# Patient Record
Sex: Female | Born: 1935 | Race: White | Hispanic: No
Health system: Southern US, Community
[De-identification: ages and names within clinical notes are randomized; demographics above are authoritative.]

---

## 2015-12-31 ENCOUNTER — Ambulatory Visit (HOSPITAL_COMMUNITY)
Admission: AD | Admit: 2015-12-31 | Discharge: 2015-12-31 | Disposition: A | Discharge status: Home / Self Care | Payer: Medicare Other | Source: Other Acute Inpatient Hospital | Attending: Internal Medicine | Admitting: Internal Medicine

## 2015-12-31 ENCOUNTER — Inpatient Hospital Stay
Admission: AD | Admit: 2015-12-31 | Discharge: 2016-01-15 | Disposition: A | Discharge status: Hospice | Payer: Self-pay | Source: Ambulatory Visit | Attending: Internal Medicine | Admitting: Internal Medicine

## 2015-12-31 DIAGNOSIS — Z4659 Encounter for fitting and adjustment of other gastrointestinal appliance and device: Secondary | ICD-10-CM

## 2015-12-31 DIAGNOSIS — J9690 Respiratory failure, unspecified, unspecified whether with hypoxia or hypercapnia: Secondary | ICD-10-CM

## 2015-12-31 DIAGNOSIS — J189 Pneumonia, unspecified organism: Secondary | ICD-10-CM

## 2015-12-31 DIAGNOSIS — I509 Heart failure, unspecified: Secondary | ICD-10-CM | POA: Insufficient documentation

## 2015-12-31 LAB — BLOOD GAS, ARTERIAL
Acid-Base Excess: 0.8 mmol/L (ref 0.0–2.0)
BICARBONATE: 24.8 meq/L — AB (ref 20.0–24.0)
FIO2: 1
O2 SAT: 100 %
PCO2 ART: 39.8 mmHg (ref 35.0–45.0)
PH ART: 7.412 (ref 7.350–7.450)
PO2 ART: 257 mmHg — AB (ref 80.0–100.0)
Patient temperature: 98.6
TCO2: 26.1 mmol/L (ref 0–100)

## 2016-01-01 ENCOUNTER — Other Ambulatory Visit (HOSPITAL_COMMUNITY): Payer: Self-pay

## 2016-01-01 LAB — CBC WITH DIFFERENTIAL/PLATELET
Basophils Absolute: 0 10*3/uL (ref 0.0–0.1)
Basophils Relative: 0 %
EOS ABS: 0.3 10*3/uL (ref 0.0–0.7)
EOS PCT: 2 %
HEMATOCRIT: 41.4 % (ref 36.0–46.0)
HEMOGLOBIN: 12.1 g/dL (ref 12.0–15.0)
LYMPHS ABS: 1 10*3/uL (ref 0.7–4.0)
Lymphocytes Relative: 7 %
MCH: 25.3 pg — AB (ref 26.0–34.0)
MCHC: 29.2 g/dL — ABNORMAL LOW (ref 30.0–36.0)
MCV: 86.6 fL (ref 78.0–100.0)
MONO ABS: 1.2 10*3/uL — AB (ref 0.1–1.0)
MONOS PCT: 8 %
Neutro Abs: 12.2 10*3/uL — ABNORMAL HIGH (ref 1.7–7.7)
Neutrophils Relative %: 83 %
Platelets: 174 10*3/uL (ref 150–400)
RBC: 4.78 MIL/uL (ref 3.87–5.11)
RDW: 16.7 % — ABNORMAL HIGH (ref 11.5–15.5)
WBC: 14.7 10*3/uL — ABNORMAL HIGH (ref 4.0–10.5)

## 2016-01-01 LAB — BLOOD GAS, ARTERIAL
ACID-BASE EXCESS: 1 mmol/L (ref 0.0–2.0)
BICARBONATE: 25 meq/L — AB (ref 20.0–24.0)
O2 Content: 5 L/min
O2 SAT: 97.2 %
PATIENT TEMPERATURE: 98.6
PO2 ART: 89.1 mmHg (ref 80.0–100.0)
TCO2: 26.2 mmol/L (ref 0–100)
pCO2 arterial: 39.8 mmHg (ref 35.0–45.0)
pH, Arterial: 7.414 (ref 7.350–7.450)

## 2016-01-01 LAB — COMPREHENSIVE METABOLIC PANEL
ALBUMIN: 2.6 g/dL — AB (ref 3.5–5.0)
ALK PHOS: 87 U/L (ref 38–126)
ALT: 72 U/L — AB (ref 14–54)
AST: 41 U/L (ref 15–41)
Anion gap: 14 (ref 5–15)
BUN: 52 mg/dL — AB (ref 6–20)
CALCIUM: 8.6 mg/dL — AB (ref 8.9–10.3)
CHLORIDE: 104 mmol/L (ref 101–111)
CO2: 24 mmol/L (ref 22–32)
CREATININE: 1.19 mg/dL — AB (ref 0.44–1.00)
GFR calc non Af Amer: 41 mL/min — ABNORMAL LOW (ref >60)
GFR, EST AFRICAN AMERICAN: 48 mL/min — AB (ref >60)
GLUCOSE: 90 mg/dL (ref 65–99)
Potassium: 4 mmol/L (ref 3.5–5.1)
SODIUM: 142 mmol/L (ref 135–145)
Total Bilirubin: 1.2 mg/dL (ref 0.3–1.2)
Total Protein: 5.3 g/dL — ABNORMAL LOW (ref 6.5–8.1)

## 2016-01-01 LAB — PHOSPHORUS: PHOSPHORUS: 3.4 mg/dL (ref 2.5–4.6)

## 2016-01-01 LAB — PROTIME-INR
INR: 1.49 (ref 0.00–1.49)
PROTHROMBIN TIME: 18.1 s — AB (ref 11.6–15.2)

## 2016-01-01 LAB — TROPONIN I: Troponin I: 0.1 ng/mL — ABNORMAL HIGH (ref <0.031)

## 2016-01-01 LAB — CK TOTAL AND CKMB (NOT AT ARMC)
CK TOTAL: 25 U/L — AB (ref 38–234)
CK, MB: 3.1 ng/mL (ref 0.5–5.0)
Relative Index: INVALID (ref 0.0–2.5)

## 2016-01-01 LAB — TSH: TSH: 9.958 u[IU]/mL — ABNORMAL HIGH (ref 0.350–4.500)

## 2016-01-01 LAB — MAGNESIUM: MAGNESIUM: 2 mg/dL (ref 1.7–2.4)

## 2016-01-01 LAB — PROCALCITONIN: Procalcitonin: <0.1 ng/mL

## 2016-01-02 LAB — PHOSPHORUS: Phosphorus: 2.9 mg/dL (ref 2.5–4.6)

## 2016-01-02 LAB — CBC WITH DIFFERENTIAL/PLATELET
Basophils Absolute: 0 10*3/uL (ref 0.0–0.1)
Basophils Relative: 0 %
EOS ABS: 0.4 10*3/uL (ref 0.0–0.7)
Eosinophils Relative: 3 %
HEMATOCRIT: 39.2 % (ref 36.0–46.0)
HEMOGLOBIN: 11.4 g/dL — AB (ref 12.0–15.0)
LYMPHS ABS: 1 10*3/uL (ref 0.7–4.0)
Lymphocytes Relative: 7 %
MCH: 25.3 pg — AB (ref 26.0–34.0)
MCHC: 29.1 g/dL — AB (ref 30.0–36.0)
MCV: 87.1 fL (ref 78.0–100.0)
MONO ABS: 1 10*3/uL (ref 0.1–1.0)
MONOS PCT: 7 %
NEUTROS PCT: 83 %
Neutro Abs: 11.4 10*3/uL — ABNORMAL HIGH (ref 1.7–7.7)
Platelets: 145 10*3/uL — ABNORMAL LOW (ref 150–400)
RBC: 4.5 MIL/uL (ref 3.87–5.11)
RDW: 16.8 % — ABNORMAL HIGH (ref 11.5–15.5)
WBC: 13.7 10*3/uL — ABNORMAL HIGH (ref 4.0–10.5)

## 2016-01-02 LAB — COMPREHENSIVE METABOLIC PANEL
ALK PHOS: 77 U/L (ref 38–126)
ALT: 56 U/L — ABNORMAL HIGH (ref 14–54)
ANION GAP: 8 (ref 5–15)
AST: 32 U/L (ref 15–41)
Albumin: 2.4 g/dL — ABNORMAL LOW (ref 3.5–5.0)
BILIRUBIN TOTAL: 1.1 mg/dL (ref 0.3–1.2)
BUN: 50 mg/dL — ABNORMAL HIGH (ref 6–20)
CALCIUM: 8.6 mg/dL — AB (ref 8.9–10.3)
CO2: 27 mmol/L (ref 22–32)
Chloride: 109 mmol/L (ref 101–111)
Creatinine, Ser: 1.29 mg/dL — ABNORMAL HIGH (ref 0.44–1.00)
GFR calc non Af Amer: 37 mL/min — ABNORMAL LOW (ref >60)
GFR, EST AFRICAN AMERICAN: 43 mL/min — AB (ref >60)
Glucose, Bld: 90 mg/dL (ref 65–99)
Potassium: 3.7 mmol/L (ref 3.5–5.1)
SODIUM: 144 mmol/L (ref 135–145)
TOTAL PROTEIN: 5.2 g/dL — AB (ref 6.5–8.1)

## 2016-01-02 LAB — HEMOGLOBIN A1C
Hgb A1c MFr Bld: 5.8 % — ABNORMAL HIGH (ref 4.8–5.6)
Mean Plasma Glucose: 120 mg/dL

## 2016-01-02 LAB — MAGNESIUM: Magnesium: 2.1 mg/dL (ref 1.7–2.4)

## 2016-01-03 LAB — BASIC METABOLIC PANEL
ANION GAP: 12 (ref 5–15)
BUN: 46 mg/dL — AB (ref 6–20)
CALCIUM: 8.6 mg/dL — AB (ref 8.9–10.3)
CO2: 27 mmol/L (ref 22–32)
Chloride: 108 mmol/L (ref 101–111)
Creatinine, Ser: 1.14 mg/dL — ABNORMAL HIGH (ref 0.44–1.00)
GFR calc Af Amer: 50 mL/min — ABNORMAL LOW (ref >60)
GFR, EST NON AFRICAN AMERICAN: 44 mL/min — AB (ref >60)
GLUCOSE: 100 mg/dL — AB (ref 65–99)
POTASSIUM: 3.4 mmol/L — AB (ref 3.5–5.1)
SODIUM: 147 mmol/L — AB (ref 135–145)

## 2016-01-03 LAB — CBC WITH DIFFERENTIAL/PLATELET
BASOS ABS: 0 10*3/uL (ref 0.0–0.1)
Basophils Relative: 0 %
EOS ABS: 0.2 10*3/uL (ref 0.0–0.7)
EOS PCT: 2 %
HCT: 37.8 % (ref 36.0–46.0)
Hemoglobin: 11 g/dL — ABNORMAL LOW (ref 12.0–15.0)
Lymphocytes Relative: 7 %
Lymphs Abs: 0.9 10*3/uL (ref 0.7–4.0)
MCH: 25.2 pg — AB (ref 26.0–34.0)
MCHC: 29.1 g/dL — ABNORMAL LOW (ref 30.0–36.0)
MCV: 86.7 fL (ref 78.0–100.0)
MONO ABS: 1 10*3/uL (ref 0.1–1.0)
Monocytes Relative: 8 %
Neutro Abs: 10.1 10*3/uL — ABNORMAL HIGH (ref 1.7–7.7)
Neutrophils Relative %: 83 %
PLATELETS: 143 10*3/uL — AB (ref 150–400)
RBC: 4.36 MIL/uL (ref 3.87–5.11)
RDW: 17.1 % — AB (ref 11.5–15.5)
WBC: 12.1 10*3/uL — AB (ref 4.0–10.5)

## 2016-01-03 LAB — PHOSPHORUS: Phosphorus: 2.8 mg/dL (ref 2.5–4.6)

## 2016-01-03 LAB — MAGNESIUM: MAGNESIUM: 1.9 mg/dL (ref 1.7–2.4)

## 2016-01-04 LAB — RENAL FUNCTION PANEL
ALBUMIN: 2.4 g/dL — AB (ref 3.5–5.0)
ANION GAP: 13 (ref 5–15)
BUN: 44 mg/dL — AB (ref 6–20)
CO2: 24 mmol/L (ref 22–32)
Calcium: 8.8 mg/dL — ABNORMAL LOW (ref 8.9–10.3)
Chloride: 109 mmol/L (ref 101–111)
Creatinine, Ser: 1.23 mg/dL — ABNORMAL HIGH (ref 0.44–1.00)
GFR calc Af Amer: 46 mL/min — ABNORMAL LOW (ref >60)
GFR calc non Af Amer: 40 mL/min — ABNORMAL LOW (ref >60)
GLUCOSE: 131 mg/dL — AB (ref 65–99)
PHOSPHORUS: 2.5 mg/dL (ref 2.5–4.6)
POTASSIUM: 4.1 mmol/L (ref 3.5–5.1)
Sodium: 146 mmol/L — ABNORMAL HIGH (ref 135–145)

## 2016-01-04 LAB — BRAIN NATRIURETIC PEPTIDE: B Natriuretic Peptide: 2762.1 pg/mL — ABNORMAL HIGH (ref 0.0–100.0)

## 2016-01-04 LAB — MAGNESIUM: Magnesium: 1.9 mg/dL (ref 1.7–2.4)

## 2016-01-06 LAB — RENAL FUNCTION PANEL
ANION GAP: 11 (ref 5–15)
Albumin: 2.5 g/dL — ABNORMAL LOW (ref 3.5–5.0)
BUN: 54 mg/dL — ABNORMAL HIGH (ref 6–20)
CALCIUM: 8.8 mg/dL — AB (ref 8.9–10.3)
CHLORIDE: 106 mmol/L (ref 101–111)
CO2: 27 mmol/L (ref 22–32)
CREATININE: 1.67 mg/dL — AB (ref 0.44–1.00)
GFR, EST AFRICAN AMERICAN: 32 mL/min — AB (ref >60)
GFR, EST NON AFRICAN AMERICAN: 27 mL/min — AB (ref >60)
Glucose, Bld: 91 mg/dL (ref 65–99)
Phosphorus: 4.1 mg/dL (ref 2.5–4.6)
Potassium: 3.8 mmol/L (ref 3.5–5.1)
SODIUM: 144 mmol/L (ref 135–145)

## 2016-01-06 LAB — CBC WITH DIFFERENTIAL/PLATELET
BASOS ABS: 0.1 10*3/uL (ref 0.0–0.1)
Basophils Relative: 1 %
EOS PCT: 3 %
Eosinophils Absolute: 0.3 10*3/uL (ref 0.0–0.7)
HCT: 38.8 % (ref 36.0–46.0)
Hemoglobin: 10.9 g/dL — ABNORMAL LOW (ref 12.0–15.0)
LYMPHS ABS: 1.1 10*3/uL (ref 0.7–4.0)
LYMPHS PCT: 11 %
MCH: 24.9 pg — AB (ref 26.0–34.0)
MCHC: 28.1 g/dL — ABNORMAL LOW (ref 30.0–36.0)
MCV: 88.6 fL (ref 78.0–100.0)
Monocytes Absolute: 0.6 10*3/uL (ref 0.1–1.0)
Monocytes Relative: 6 %
NEUTROS PCT: 79 %
Neutro Abs: 8.3 10*3/uL — ABNORMAL HIGH (ref 1.7–7.7)
PLATELETS: 163 10*3/uL (ref 150–400)
RBC: 4.38 MIL/uL (ref 3.87–5.11)
RDW: 17.4 % — ABNORMAL HIGH (ref 11.5–15.5)
WBC: 10.4 10*3/uL (ref 4.0–10.5)

## 2016-01-06 LAB — MAGNESIUM: MAGNESIUM: 1.9 mg/dL (ref 1.7–2.4)

## 2016-01-07 LAB — MAGNESIUM: Magnesium: 1.8 mg/dL (ref 1.7–2.4)

## 2016-01-07 LAB — RENAL FUNCTION PANEL
Albumin: 2.4 g/dL — ABNORMAL LOW (ref 3.5–5.0)
Anion gap: 12 (ref 5–15)
BUN: 57 mg/dL — AB (ref 6–20)
CHLORIDE: 102 mmol/L (ref 101–111)
CO2: 25 mmol/L (ref 22–32)
Calcium: 8.4 mg/dL — ABNORMAL LOW (ref 8.9–10.3)
Creatinine, Ser: 1.86 mg/dL — ABNORMAL HIGH (ref 0.44–1.00)
GFR calc Af Amer: 28 mL/min — ABNORMAL LOW (ref >60)
GFR calc non Af Amer: 24 mL/min — ABNORMAL LOW (ref >60)
GLUCOSE: 95 mg/dL (ref 65–99)
POTASSIUM: 3.8 mmol/L (ref 3.5–5.1)
Phosphorus: 3.9 mg/dL (ref 2.5–4.6)
Sodium: 139 mmol/L (ref 135–145)

## 2016-01-08 LAB — RENAL FUNCTION PANEL
Albumin: 2.5 g/dL — ABNORMAL LOW (ref 3.5–5.0)
Anion gap: 13 (ref 5–15)
BUN: 71 mg/dL — ABNORMAL HIGH (ref 6–20)
CALCIUM: 8.5 mg/dL — AB (ref 8.9–10.3)
CO2: 24 mmol/L (ref 22–32)
CREATININE: 2.54 mg/dL — AB (ref 0.44–1.00)
Chloride: 102 mmol/L (ref 101–111)
GFR, EST AFRICAN AMERICAN: 19 mL/min — AB (ref >60)
GFR, EST NON AFRICAN AMERICAN: 17 mL/min — AB (ref >60)
Glucose, Bld: 100 mg/dL — ABNORMAL HIGH (ref 65–99)
Phosphorus: 4.8 mg/dL — ABNORMAL HIGH (ref 2.5–4.6)
Potassium: 4.3 mmol/L (ref 3.5–5.1)
SODIUM: 139 mmol/L (ref 135–145)

## 2016-01-08 LAB — CBC WITH DIFFERENTIAL/PLATELET
BASOS ABS: 0.1 10*3/uL (ref 0.0–0.1)
Basophils Relative: 1 %
EOS ABS: 0.2 10*3/uL (ref 0.0–0.7)
EOS PCT: 1 %
HCT: 38.6 % (ref 36.0–46.0)
Hemoglobin: 11.7 g/dL — ABNORMAL LOW (ref 12.0–15.0)
Lymphocytes Relative: 13 %
Lymphs Abs: 1.8 10*3/uL (ref 0.7–4.0)
MCH: 26.3 pg (ref 26.0–34.0)
MCHC: 30.3 g/dL (ref 30.0–36.0)
MCV: 86.7 fL (ref 78.0–100.0)
Monocytes Absolute: 0.9 10*3/uL (ref 0.1–1.0)
Monocytes Relative: 7 %
Neutro Abs: 10.6 10*3/uL — ABNORMAL HIGH (ref 1.7–7.7)
Neutrophils Relative %: 78 %
PLATELETS: 232 10*3/uL (ref 150–400)
RBC: 4.45 MIL/uL (ref 3.87–5.11)
RDW: 17.5 % — ABNORMAL HIGH (ref 11.5–15.5)
WBC: 13.5 10*3/uL — AB (ref 4.0–10.5)

## 2016-01-08 LAB — MAGNESIUM: MAGNESIUM: 2 mg/dL (ref 1.7–2.4)

## 2016-01-09 ENCOUNTER — Other Ambulatory Visit (HOSPITAL_COMMUNITY): Payer: Self-pay

## 2016-01-09 LAB — CBC WITH DIFFERENTIAL/PLATELET
Basophils Absolute: 0.1 10*3/uL (ref 0.0–0.1)
Basophils Relative: 0 %
EOS PCT: 1 %
Eosinophils Absolute: 0.1 10*3/uL (ref 0.0–0.7)
HEMATOCRIT: 41.2 % (ref 36.0–46.0)
HEMOGLOBIN: 12.6 g/dL (ref 12.0–15.0)
LYMPHS PCT: 12 %
Lymphs Abs: 2 10*3/uL (ref 0.7–4.0)
MCH: 25.9 pg — AB (ref 26.0–34.0)
MCHC: 30.6 g/dL (ref 30.0–36.0)
MCV: 84.6 fL (ref 78.0–100.0)
Monocytes Absolute: 1 10*3/uL (ref 0.1–1.0)
Monocytes Relative: 6 %
NEUTROS ABS: 14 10*3/uL — AB (ref 1.7–7.7)
Neutrophils Relative %: 81 %
Platelets: 276 10*3/uL (ref 150–400)
RBC: 4.87 MIL/uL (ref 3.87–5.11)
RDW: 17.5 % — ABNORMAL HIGH (ref 11.5–15.5)
WBC: 17.2 10*3/uL — AB (ref 4.0–10.5)

## 2016-01-09 LAB — RENAL FUNCTION PANEL
ANION GAP: 13 (ref 5–15)
Albumin: 2.3 g/dL — ABNORMAL LOW (ref 3.5–5.0)
BUN: 85 mg/dL — ABNORMAL HIGH (ref 6–20)
CHLORIDE: 101 mmol/L (ref 101–111)
CO2: 22 mmol/L (ref 22–32)
Calcium: 8.5 mg/dL — ABNORMAL LOW (ref 8.9–10.3)
Creatinine, Ser: 3.37 mg/dL — ABNORMAL HIGH (ref 0.44–1.00)
GFR calc non Af Amer: 12 mL/min — ABNORMAL LOW (ref >60)
GFR, EST AFRICAN AMERICAN: 14 mL/min — AB (ref >60)
Glucose, Bld: 133 mg/dL — ABNORMAL HIGH (ref 65–99)
POTASSIUM: 4.5 mmol/L (ref 3.5–5.1)
Phosphorus: 6.1 mg/dL — ABNORMAL HIGH (ref 2.5–4.6)
Sodium: 136 mmol/L (ref 135–145)

## 2016-01-09 LAB — PROTIME-INR
INR: 1.8 — AB (ref 0.00–1.49)
PROTHROMBIN TIME: 20.9 s — AB (ref 11.6–15.2)

## 2016-01-09 LAB — MAGNESIUM: MAGNESIUM: 2.2 mg/dL (ref 1.7–2.4)

## 2016-01-10 ENCOUNTER — Other Ambulatory Visit (HOSPITAL_COMMUNITY): Payer: Self-pay

## 2016-01-10 LAB — PROTIME-INR
INR: 1.82 — ABNORMAL HIGH (ref 0.00–1.49)
Prothrombin Time: 21 seconds — ABNORMAL HIGH (ref 11.6–15.2)

## 2016-01-10 LAB — RENAL FUNCTION PANEL
Albumin: 2.2 g/dL — ABNORMAL LOW (ref 3.5–5.0)
Anion gap: 12 (ref 5–15)
BUN: 95 mg/dL — AB (ref 6–20)
CHLORIDE: 105 mmol/L (ref 101–111)
CO2: 24 mmol/L (ref 22–32)
CREATININE: 4.26 mg/dL — AB (ref 0.44–1.00)
Calcium: 8.4 mg/dL — ABNORMAL LOW (ref 8.9–10.3)
GFR calc Af Amer: 10 mL/min — ABNORMAL LOW (ref >60)
GFR calc non Af Amer: 9 mL/min — ABNORMAL LOW (ref >60)
Glucose, Bld: 112 mg/dL — ABNORMAL HIGH (ref 65–99)
Phosphorus: 6.9 mg/dL — ABNORMAL HIGH (ref 2.5–4.6)
Potassium: 4.4 mmol/L (ref 3.5–5.1)
Sodium: 141 mmol/L (ref 135–145)

## 2016-01-10 LAB — CBC WITH DIFFERENTIAL/PLATELET
Basophils Absolute: 0.1 10*3/uL (ref 0.0–0.1)
Basophils Relative: 0 %
EOS PCT: 0 %
Eosinophils Absolute: 0.1 10*3/uL (ref 0.0–0.7)
HEMATOCRIT: 41.3 % (ref 36.0–46.0)
Hemoglobin: 12.5 g/dL (ref 12.0–15.0)
LYMPHS ABS: 1.9 10*3/uL (ref 0.7–4.0)
LYMPHS PCT: 11 %
MCH: 25.6 pg — AB (ref 26.0–34.0)
MCHC: 30.3 g/dL (ref 30.0–36.0)
MCV: 84.6 fL (ref 78.0–100.0)
MONO ABS: 1 10*3/uL (ref 0.1–1.0)
Monocytes Relative: 6 %
Neutro Abs: 14 10*3/uL — ABNORMAL HIGH (ref 1.7–7.7)
Neutrophils Relative %: 83 %
PLATELETS: 275 10*3/uL (ref 150–400)
RBC: 4.88 MIL/uL (ref 3.87–5.11)
RDW: 17.7 % — AB (ref 11.5–15.5)
WBC: 17 10*3/uL — ABNORMAL HIGH (ref 4.0–10.5)

## 2016-01-10 LAB — MAGNESIUM: Magnesium: 2.2 mg/dL (ref 1.7–2.4)

## 2016-01-11 LAB — CBC WITH DIFFERENTIAL/PLATELET
Basophils Absolute: 0 10*3/uL (ref 0.0–0.1)
Basophils Relative: 0 %
EOS ABS: 0.1 10*3/uL (ref 0.0–0.7)
Eosinophils Relative: 1 %
HEMATOCRIT: 38.4 % (ref 36.0–46.0)
HEMOGLOBIN: 11.5 g/dL — AB (ref 12.0–15.0)
LYMPHS ABS: 1.3 10*3/uL (ref 0.7–4.0)
LYMPHS PCT: 9 %
MCH: 25.3 pg — AB (ref 26.0–34.0)
MCHC: 29.9 g/dL — AB (ref 30.0–36.0)
MCV: 84.6 fL (ref 78.0–100.0)
MONOS PCT: 7 %
Monocytes Absolute: 1 10*3/uL (ref 0.1–1.0)
NEUTROS PCT: 83 %
Neutro Abs: 12.9 10*3/uL — ABNORMAL HIGH (ref 1.7–7.7)
Platelets: 249 10*3/uL (ref 150–400)
RBC: 4.54 MIL/uL (ref 3.87–5.11)
RDW: 17.6 % — ABNORMAL HIGH (ref 11.5–15.5)
WBC: 15.4 10*3/uL — ABNORMAL HIGH (ref 4.0–10.5)

## 2016-01-11 LAB — URINALYSIS, ROUTINE W REFLEX MICROSCOPIC
GLUCOSE, UA: 100 mg/dL — AB
KETONES UR: 15 mg/dL — AB
Nitrite: POSITIVE — AB
PH: 5 (ref 5.0–8.0)
Protein, ur: 100 mg/dL — AB
Specific Gravity, Urine: 1.03 (ref 1.005–1.030)

## 2016-01-11 LAB — RENAL FUNCTION PANEL
ANION GAP: 15 (ref 5–15)
Albumin: 2.3 g/dL — ABNORMAL LOW (ref 3.5–5.0)
BUN: 113 mg/dL — ABNORMAL HIGH (ref 6–20)
CALCIUM: 8.4 mg/dL — AB (ref 8.9–10.3)
CHLORIDE: 103 mmol/L (ref 101–111)
CO2: 21 mmol/L — AB (ref 22–32)
Creatinine, Ser: 4.76 mg/dL — ABNORMAL HIGH (ref 0.44–1.00)
GFR calc non Af Amer: 8 mL/min — ABNORMAL LOW (ref >60)
GFR, EST AFRICAN AMERICAN: 9 mL/min — AB (ref >60)
GLUCOSE: 139 mg/dL — AB (ref 65–99)
PHOSPHORUS: 6 mg/dL — AB (ref 2.5–4.6)
POTASSIUM: 4 mmol/L (ref 3.5–5.1)
SODIUM: 139 mmol/L (ref 135–145)

## 2016-01-11 LAB — PROTIME-INR
INR: 1.97 — ABNORMAL HIGH (ref 0.00–1.49)
PROTHROMBIN TIME: 22.3 s — AB (ref 11.6–15.2)

## 2016-01-11 LAB — URINE MICROSCOPIC-ADD ON

## 2016-01-11 LAB — MAGNESIUM: MAGNESIUM: 2.4 mg/dL (ref 1.7–2.4)

## 2016-01-12 LAB — PROTIME-INR
INR: 2.2 — AB (ref 0.00–1.49)
Prothrombin Time: 24.2 seconds — ABNORMAL HIGH (ref 11.6–15.2)

## 2016-01-12 LAB — URINE CULTURE

## 2016-01-13 ENCOUNTER — Other Ambulatory Visit (HOSPITAL_COMMUNITY): Payer: Self-pay

## 2016-01-13 LAB — CBC
HEMATOCRIT: 34.3 % — AB (ref 36.0–46.0)
HEMOGLOBIN: 10.4 g/dL — AB (ref 12.0–15.0)
MCH: 26.3 pg (ref 26.0–34.0)
MCHC: 30.3 g/dL (ref 30.0–36.0)
MCV: 86.6 fL (ref 78.0–100.0)
Platelets: 242 10*3/uL (ref 150–400)
RBC: 3.96 MIL/uL (ref 3.87–5.11)
RDW: 18.2 % — ABNORMAL HIGH (ref 11.5–15.5)
WBC: 14.7 10*3/uL — ABNORMAL HIGH (ref 4.0–10.5)

## 2016-01-13 LAB — BLOOD GAS, ARTERIAL
Acid-base deficit: 6.3 mmol/L — ABNORMAL HIGH (ref 0.0–2.0)
Bicarbonate: 19.2 mEq/L — ABNORMAL LOW (ref 20.0–24.0)
FIO2: 1
O2 Saturation: 93.7 %
PCO2 ART: 39.3 mmHg (ref 35.0–45.0)
PH ART: 7.303 — AB (ref 7.350–7.450)
Patient temperature: 96.6
TCO2: 20.4 mmol/L (ref 0–100)
pO2, Arterial: 73.1 mmHg — ABNORMAL LOW (ref 80.0–100.0)

## 2016-01-13 LAB — PROTIME-INR
INR: 2.04 — ABNORMAL HIGH (ref 0.00–1.49)
Prothrombin Time: 22.9 seconds — ABNORMAL HIGH (ref 11.6–15.2)

## 2016-01-13 LAB — BASIC METABOLIC PANEL
Anion gap: 17 — ABNORMAL HIGH (ref 5–15)
BUN: 148 mg/dL — ABNORMAL HIGH (ref 6–20)
CHLORIDE: 107 mmol/L (ref 101–111)
CO2: 15 mmol/L — ABNORMAL LOW (ref 22–32)
CREATININE: 5.96 mg/dL — AB (ref 0.44–1.00)
Calcium: 8.9 mg/dL (ref 8.9–10.3)
GFR, EST AFRICAN AMERICAN: 7 mL/min — AB (ref >60)
GFR, EST NON AFRICAN AMERICAN: 6 mL/min — AB (ref >60)
Glucose, Bld: 115 mg/dL — ABNORMAL HIGH (ref 65–99)
POTASSIUM: 5.3 mmol/L — AB (ref 3.5–5.1)
SODIUM: 139 mmol/L (ref 135–145)

## 2016-01-14 LAB — PROTIME-INR
INR: 2.47 — AB (ref 0.00–1.49)
PROTHROMBIN TIME: 26.5 s — AB (ref 11.6–15.2)

## 2016-01-14 NOTE — Consult Note (Signed)
Date: 01/14/2016                  Patient Name:  Briana Mendez  MRN: [id number]  DOB: 08-14-1931  Age / Sex: 82 y.o., female         PCP: No PCP Per Patient                 Service Requesting Consult: [facility]                 Reason for Consult: ARF            History of Present Illness: Patient is a 82 y.o. female with medical problems of osteoarthritis, hypothyroidism, breast cancer, hypertension, left mastectomy, hysterectomy, who was admitted to [facility] on 12/31/2015.  Patient was originally admitted to emergency room Hospital on April 2 for pneumonia and respiratory failure. Upon presentation she had bilateral pleural effusions and consolidation consistent with pneumonia. According to her discharge summary, her 2-D echo showed mitral regurgitation. She had several episodes of flash pulmonary edema. she was treated with multiple antibiotics. Patient underwent a cardiac catheterization on April 17 Nephrology consult has been requested for evaluation of acute renal failure It is noted that since April 20, her creatinine has been increasing. Most recently, and has worsened to 5.96, BUN level critically elevated at 148 Patient is obtunded, she is unable to provide any meaningful history. All information is obtained from her daughter and from patient's chart  Her pulmonary status remains tenuous. Most recent chest x-ray shows bilateral pleural effusions, diffuse pulmonary edema, worsening opacity in the left upper lobe   Medications: Outpatient medications: No prescriptions prior to admission    Current medications: No current facility-administered medications for this encounter.      Allergies: Allergies not on file    Past Medical History: No past medical history on file.   Past Surgical History: No past surgical history on file.   Family History: No family history on file.   Social History: Social History   Social  History  . Marital Status: Widowed    Spouse Name: N/A  . Number of Children: N/A  . Years of Education: N/A   Occupational History  . Not on file.   Social History Main Topics  . Smoking status: Not on file  . Smokeless tobacco: Not on file  . Alcohol Use: Not on file  . Drug Use: Not on file  . Sexual Activity: Not on file   Other Topics Concern  . Not on file   Social History Narrative  . No narrative on file     Review of Systems: unavailable due to the patient's condition Gen:  HEENT:  CV:  Resp:  GI: GU :  MS:  Derm:   Psych: Heme:  Neuro:  Endocrine  Vital Signs: There were no vitals taken for this visit.  No intake or output data in the 24 hours ending 01/14/16 1752  Weight trends: There were no vitals filed for this visit.  Physical Exam: General:  frail, elderly, critically ill-appearing  HEENT Anicteric, drug mucous membranes  Neck:  supple, no masses  Lungs: Coarse breath sounds bilaterally, requiring oxygen by facemask  Heart::  atrial fibrillation, irregular rhythm  Abdomen: Soft, nontender, nondistended  Extremities:  + dependent peripheral edema  Neurologic: Obtunded, not able to follow commands  Skin: Scattered ecchymosis             Lab results: Basic Metabolic Panel:  Recent Labs Lab 01/09/16 0615 01/10/16 0656 01/11/16 0630 01/11/16 0631 01/13/16 1140  NA 136 141  --  139 139  K 4.5 4.4  --  4.0 5.3*  CL 101 105  --  103 107  CO2 22 24  --  21* 15*  GLUCOSE 133* 112*  --  139* 115*  BUN 85* 95*  --  113* 148*  CREATININE 3.37* 4.26*  --  4.76* 5.96*  CALCIUM 8.5* 8.4*  --  8.4* 8.9  MG 2.2 2.2 2.4  --   --   PHOS 6.1* 6.9*  --  6.0*  --     Liver Function Tests:  Recent Labs Lab 01/11/16 0631  ALBUMIN 2.3*   No results for input(s): LIPASE, AMYLASE in the last 168 hours. No results for input(s): AMMONIA in the last 168 hours.  CBC:  Recent Labs Lab 01/10/16 0656 01/11/16 0630 01/13/16 1140  WBC  17.0* 15.4* 14.7*  NEUTROABS 14.0* 12.9*  --   HGB 12.5 11.5* 10.4*  HCT 41.3 38.4 34.3*  MCV 84.6 84.6 86.6  PLT 275 249 242    Cardiac Enzymes: No results for input(s): CKTOTAL, TROPONINI in the last 168 hours.  BNP: Invalid input(s): POCBNP  CBG: No results for input(s): GLUCAP in the last 168 hours.  Microbiology: Recent Results (from the past 720 hour(s))  Culture, Urine     Status: Abnormal   Collection Time: 01/10/16  5:46 PM  Result Value Ref Range Status   Specimen Description URINE, RANDOM  Final   Special Requests NONE  Final   Culture >=100,000 COLONIES/mL YEAST (A)  Final   Report Status 01/12/2016 FINAL  Final     Coagulation Studies:  Recent Labs  01/12/16 0802 01/13/16 0808 01/14/16 0625  LABPROT 24.2* 22.9* 26.5*  INR 2.20* 2.04* 2.47*    Urinalysis: No results for input(s): COLORURINE, LABSPEC, PHURINE, GLUCOSEU, HGBUR, BILIRUBINUR, KETONESUR, PROTEINUR, UROBILINOGEN, NITRITE, LEUKOCYTESUR in the last 72 hours.  Invalid input(s): APPERANCEUR      Imaging: Dg Chest Port 1 View  01/13/2016  CLINICAL DATA:  Respiratory failure.  Aspiration pneumonia. EXAM: PORTABLE CHEST 1 VIEW COMPARISON:  01/09/2016 FINDINGS: Cardiac enlargement and aortic atherosclerosis noted. There are bilateral pleural effusions identified left greater than right. Diffuse pulmonary edema noted. There is worsening airspace opacification in the left upper lobe when compared with previous exam. Similar aeration of both lower lobes. IMPRESSION: 1. Similar aeration to both lower lobes. 2. Worsening aeration to the left upper lobe. Electronically Signed   By: Signa Kell M.D.   On: 01/13/2016 10:27      Assessment & Plan: Pt is a 82 y.o. yo female with osteoarthritis, hypothyroidism, breast cancer, hypertension, left mastectomy, hysterectomy, who was admitted to [facility] on 12/31/2015.   1. Acute kidney injury on chronic kidney disease stage III Baseline  creatinine is probably 1.14/44 Patient underwent a cardiac catheterization on April 17. Therefore, her acute kidney injury is most likely secondary to IV contrast exposure  Patient he is currently oliguric. Her BUN/creatinine level a critically high. I have discussed this case with the primary team as well as patient's daughter Is overall very difficult situation.  The risks, benefits and alternatives on dialysis were discussed with patient's daughter. Dialysis and current medical condition would be a high risk procedure. We are not sure how well the patient will be able to tolerated hemodynamically. The family will get together tonight and make a decision about aggressiveness of the care. If  they decide to pursue dialysis, primary team will order temporary dialysis catheter placement by vascular radiology.  We will do a short treatment of dialysis on a daily basis and try to see if we can correct UREMIA and improve her mental status  2. Acute respiratory failure Requiring supplemental oxygen Contributing factors include pulmonary edema and pleural effusion  3. Atrial fibrillation Patient is currently on anticoagulation and diltiazem Atrial fibrillation is causing hemodynamic instability  Will follow

## 2016-01-15 LAB — PROTIME-INR
INR: 5.11 — AB (ref 0.00–1.49)
Prothrombin Time: 46 seconds — ABNORMAL HIGH (ref 11.6–15.2)

## 2016-01-15 LAB — RENAL FUNCTION PANEL
Albumin: 2.3 g/dL — ABNORMAL LOW (ref 3.5–5.0)
Anion gap: 21 — ABNORMAL HIGH (ref 5–15)
BUN: 186 mg/dL — ABNORMAL HIGH (ref 6–20)
CALCIUM: 9.3 mg/dL (ref 8.9–10.3)
CHLORIDE: 108 mmol/L (ref 101–111)
CO2: 15 mmol/L — AB (ref 22–32)
CREATININE: 7.16 mg/dL — AB (ref 0.44–1.00)
GFR calc Af Amer: 5 mL/min — ABNORMAL LOW (ref >60)
GFR calc non Af Amer: 5 mL/min — ABNORMAL LOW (ref >60)
GLUCOSE: 104 mg/dL — AB (ref 65–99)
Phosphorus: 7.6 mg/dL — ABNORMAL HIGH (ref 2.5–4.6)
Potassium: 4 mmol/L (ref 3.5–5.1)
SODIUM: 144 mmol/L (ref 135–145)

## 2016-01-15 LAB — CBC
HCT: 28 % — ABNORMAL LOW (ref 36.0–46.0)
HEMOGLOBIN: 8.7 g/dL — AB (ref 12.0–15.0)
MCH: 26.4 pg (ref 26.0–34.0)
MCHC: 31.1 g/dL (ref 30.0–36.0)
MCV: 84.8 fL (ref 78.0–100.0)
PLATELETS: 186 10*3/uL (ref 150–400)
RBC: 3.3 MIL/uL — ABNORMAL LOW (ref 3.87–5.11)
RDW: 18.4 % — ABNORMAL HIGH (ref 11.5–15.5)
WBC: 11.2 10*3/uL — ABNORMAL HIGH (ref 4.0–10.5)

## 2016-01-16 LAB — HEPATITIS B CORE ANTIBODY, TOTAL: HEP B C TOTAL AB: NEGATIVE

## 2016-01-16 LAB — HEPATITIS B SURFACE ANTIBODY,QUALITATIVE: Hep B S Ab: NONREACTIVE

## 2016-01-16 LAB — HEPATITIS B SURFACE ANTIGEN: HEP B S AG: NEGATIVE

## 2016-02-14 DEATH — deceased

## 2017-03-31 IMAGING — DX DG CHEST 1V PORT
1 series · 1 of 1 positions shown · non-contrast
Comparison: None.

CLINICAL DATA: Respiratory failure and pneumonia

EXAM:
PORTABLE CHEST 1 VIEW

[chest ap]
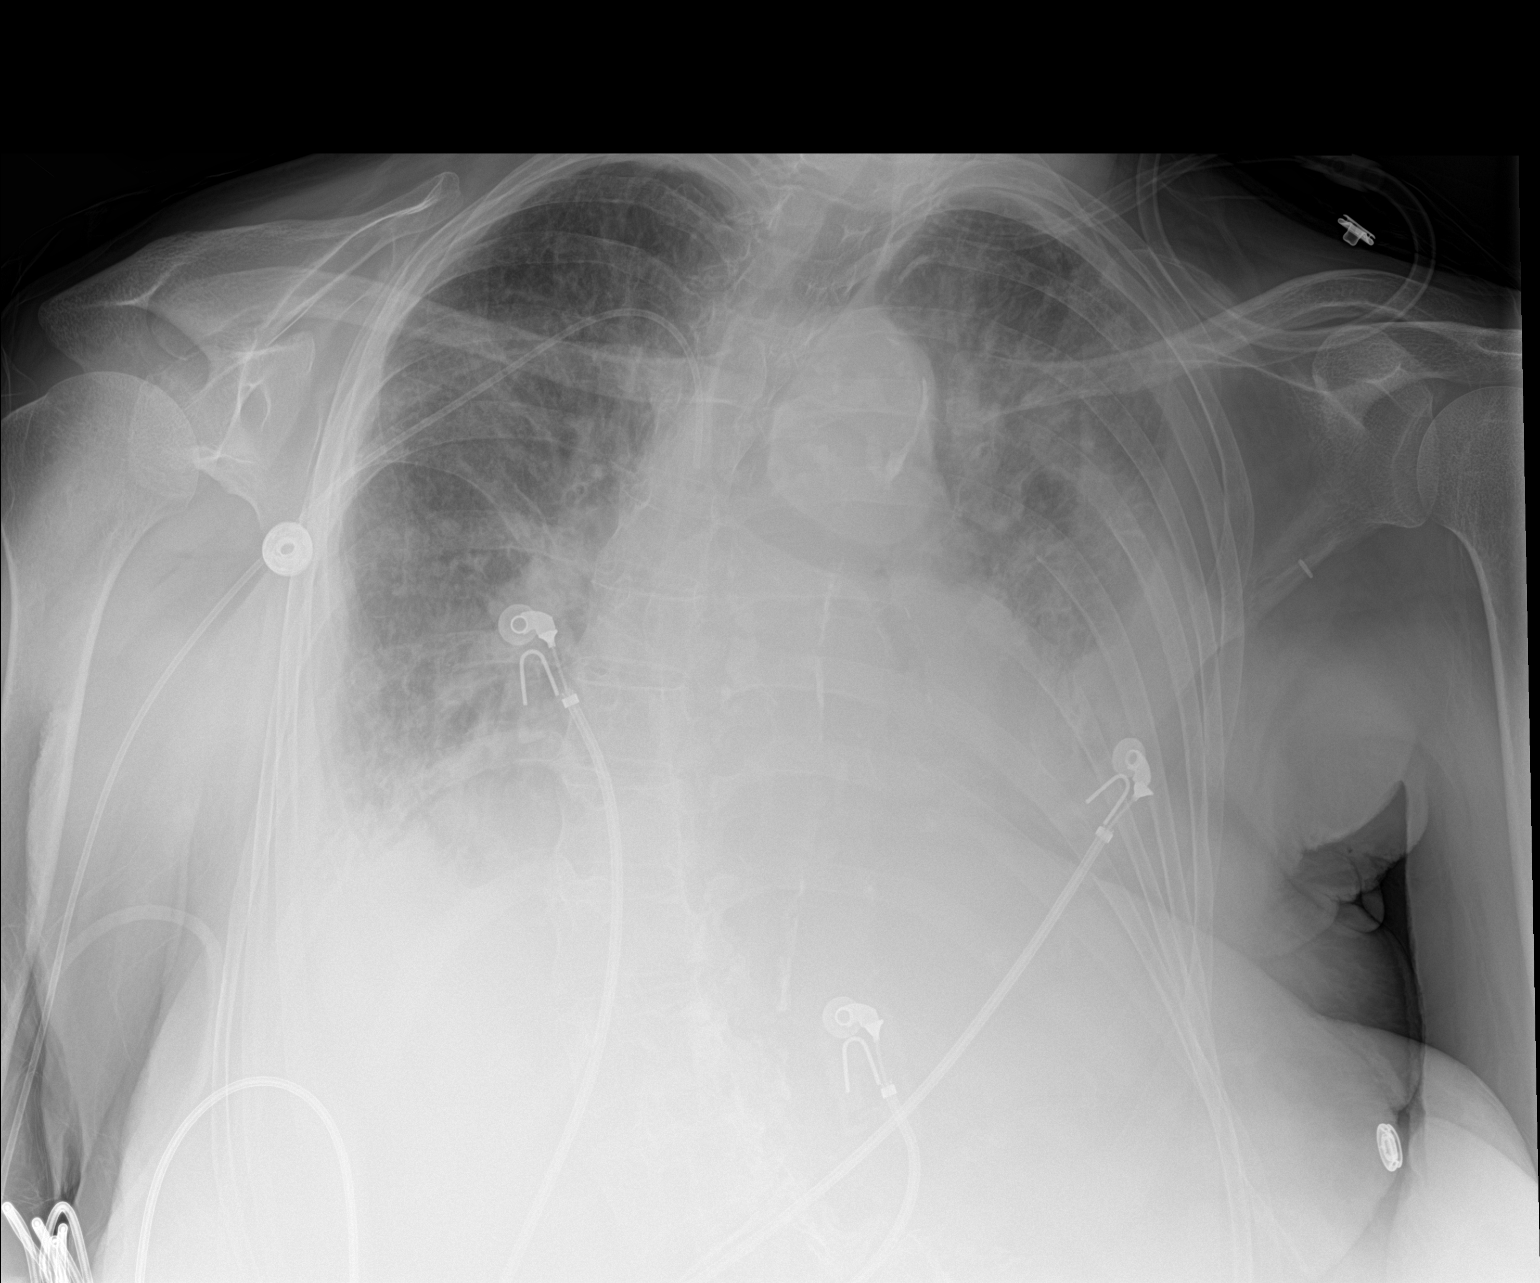

[1 of 1 positions shown; findings below may reference images not displayed]

FINDINGS: Bilateral pleural effusion with hazy appearance of the basilar
chest, left larger than right. Left pleural effusion is at least
moderate volume. The underlying lungs are obscured. There is septal
thickening reaching the apices. Mild volume loss in the right upper
lobe.

Right upper extremity PICC with tip at the SVC. Chronic
cardiomegaly.

Left axillary clip, possible previous dissection.
IMPRESSION: 1. Pleural effusions, left greater than right, and pulmonary edema.
2. Bilateral airspace opacity correlating with history of pneumonia.

## 2017-04-09 IMAGING — DX DG ABD PORTABLE 1V
1 series · 1 of 1 positions shown · non-contrast
Comparison: 01/09/2016

CLINICAL DATA: Check nasogastric catheter placement

EXAM:
PORTABLE ABDOMEN - 1 VIEW

[abdomen supine]
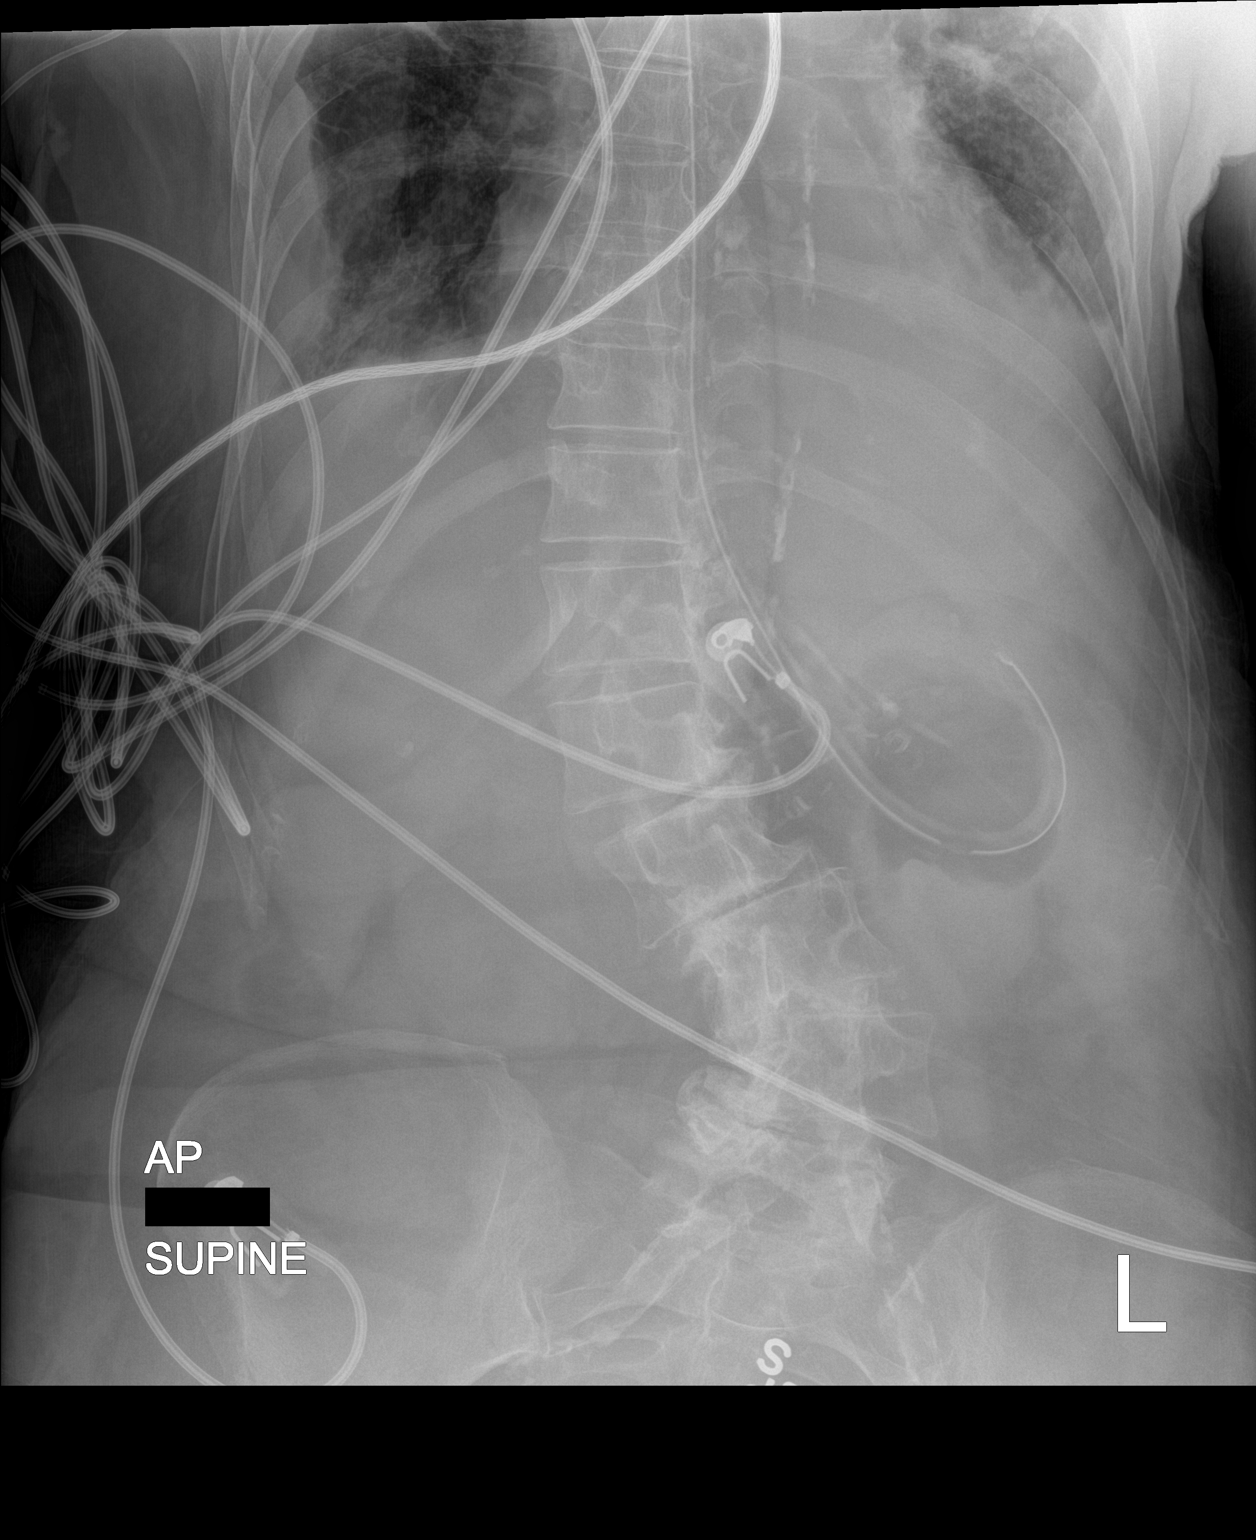

[1 of 1 positions shown; findings below may reference images not displayed]

FINDINGS: Nasogastric catheter is again noted coiled within the stomach. The T
kids proximal side port is been reduced in the interval. Stable
scoliosis of thoracolumbar spine is noted. Stable changes in the
right lung base are seen.
IMPRESSION: Nasogastric catheter within the stomach.

## 2017-04-12 IMAGING — CR DG CHEST 1V PORT
1 series · 1 of 1 positions shown · non-contrast
Comparison: 01/09/2016

CLINICAL DATA: Respiratory failure.  Aspiration pneumonia.

EXAM:
PORTABLE CHEST 1 VIEW

[AP]
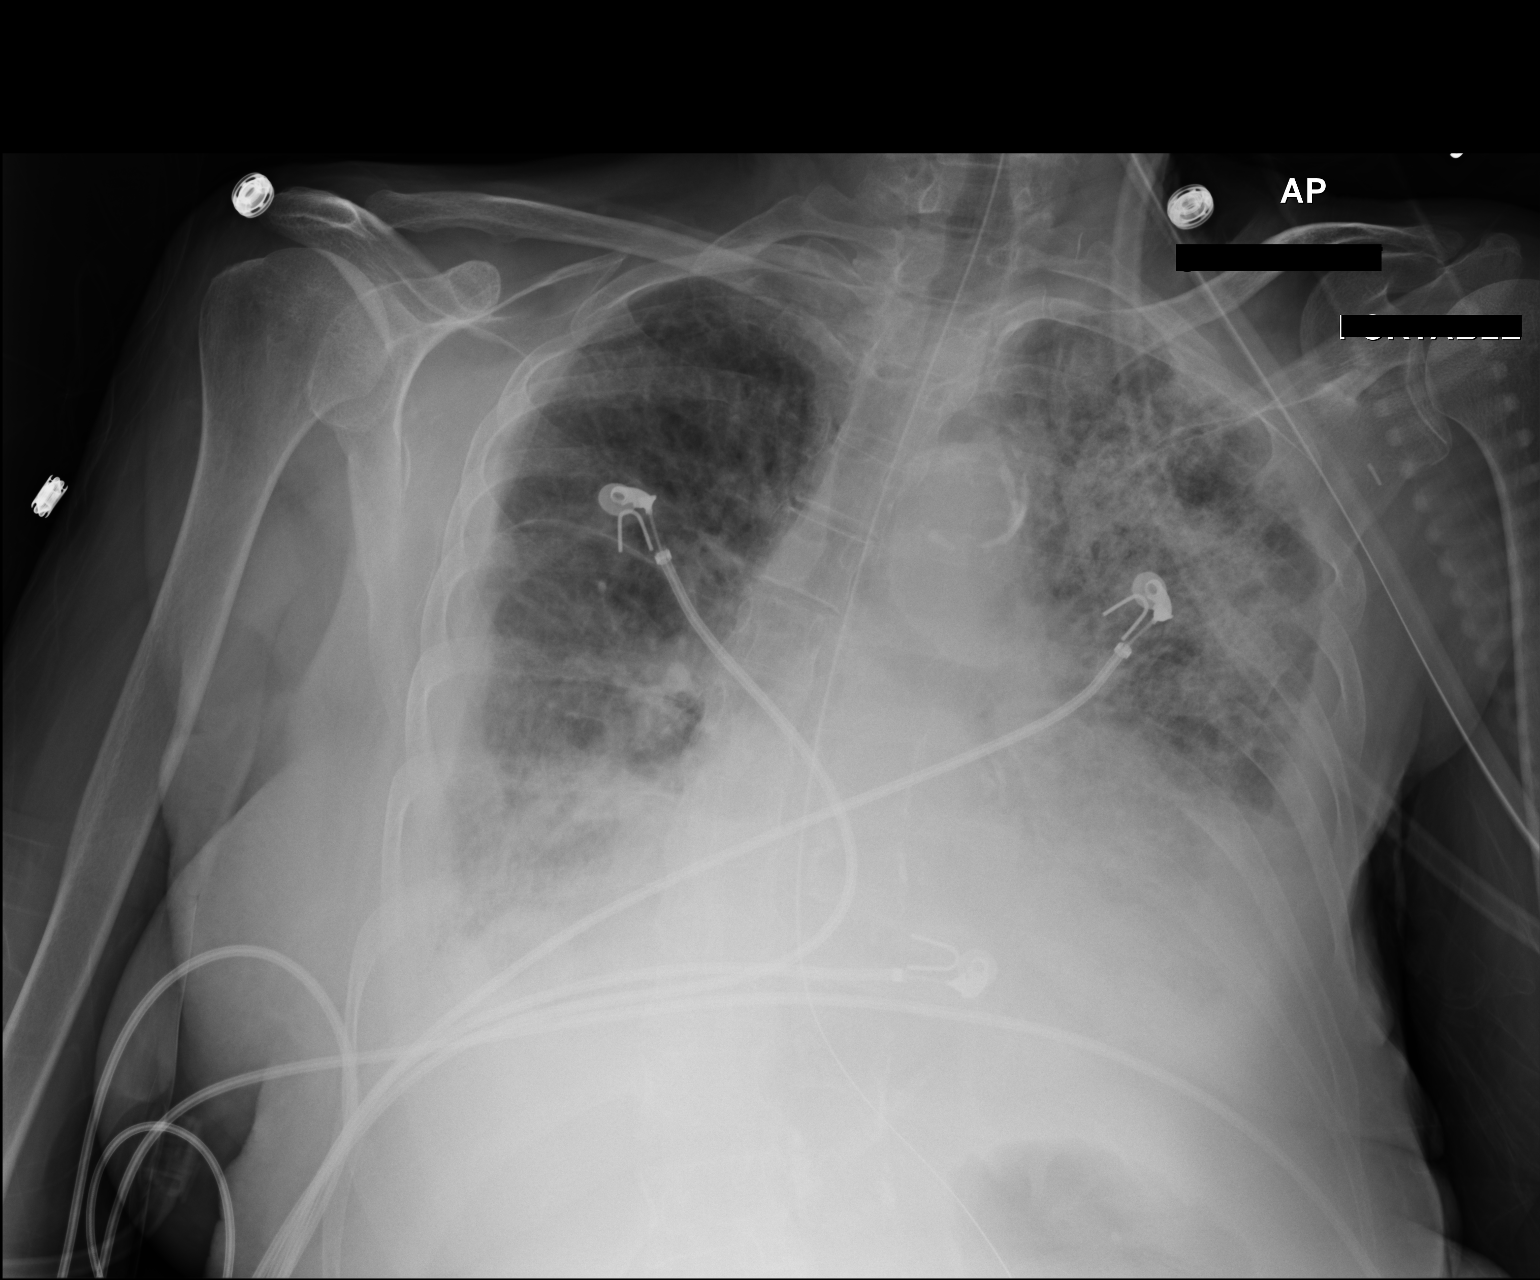

[1 of 1 positions shown; findings below may reference images not displayed]

FINDINGS: Cardiac enlargement and aortic atherosclerosis noted. There are
bilateral pleural effusions identified left greater than right.
Diffuse pulmonary edema noted. There is worsening airspace
opacification in the left upper lobe when compared with previous
exam. Similar aeration of both lower lobes.
IMPRESSION: 1. Similar aeration to both lower lobes.
2. Worsening aeration to the left upper lobe.
